# Patient Record
Sex: Female | Born: 1995 | Race: White | Hispanic: No | Smoking: Current every day smoker
Health system: Southern US, Community
[De-identification: ages and names within clinical notes are randomized; demographics above are authoritative.]

## PROBLEM LIST (undated history)

## (undated) DIAGNOSIS — E282 Polycystic ovarian syndrome: Secondary | ICD-10-CM

## (undated) HISTORY — PX: TONSILLECTOMY: SUR1361

---

## 2020-08-05 ENCOUNTER — Emergency Department (HOSPITAL_COMMUNITY)
Admission: EM | Admit: 2020-08-05 | Discharge: 2020-08-05 | Disposition: A | Discharge status: Home / Self Care | Payer: Self-pay | Attending: Emergency Medicine | Admitting: Emergency Medicine

## 2020-08-05 ENCOUNTER — Encounter (HOSPITAL_COMMUNITY): Payer: Self-pay

## 2020-08-05 ENCOUNTER — Emergency Department (HOSPITAL_COMMUNITY): Payer: Self-pay

## 2020-08-05 ENCOUNTER — Other Ambulatory Visit: Payer: Self-pay

## 2020-08-05 DIAGNOSIS — N760 Acute vaginitis: Secondary | ICD-10-CM | POA: Insufficient documentation

## 2020-08-05 DIAGNOSIS — R197 Diarrhea, unspecified: Secondary | ICD-10-CM | POA: Insufficient documentation

## 2020-08-05 DIAGNOSIS — F17210 Nicotine dependence, cigarettes, uncomplicated: Secondary | ICD-10-CM | POA: Insufficient documentation

## 2020-08-05 DIAGNOSIS — R102 Pelvic and perineal pain: Secondary | ICD-10-CM

## 2020-08-05 DIAGNOSIS — R110 Nausea: Secondary | ICD-10-CM | POA: Insufficient documentation

## 2020-08-05 DIAGNOSIS — R1084 Generalized abdominal pain: Secondary | ICD-10-CM | POA: Insufficient documentation

## 2020-08-05 DIAGNOSIS — B9689 Other specified bacterial agents as the cause of diseases classified elsewhere: Secondary | ICD-10-CM | POA: Insufficient documentation

## 2020-08-05 DIAGNOSIS — R531 Weakness: Secondary | ICD-10-CM | POA: Insufficient documentation

## 2020-08-05 DIAGNOSIS — Z20822 Contact with and (suspected) exposure to covid-19: Secondary | ICD-10-CM | POA: Insufficient documentation

## 2020-08-05 HISTORY — DX: Polycystic ovarian syndrome: E28.2

## 2020-08-05 LAB — COMPREHENSIVE METABOLIC PANEL
ALT: 23 U/L (ref 0–44)
AST: 18 U/L (ref 15–41)
Albumin: 4.3 g/dL (ref 3.5–5.0)
Alkaline Phosphatase: 36 U/L — ABNORMAL LOW (ref 38–126)
Anion gap: 4 — ABNORMAL LOW (ref 5–15)
BUN: 18 mg/dL (ref 6–20)
CO2: 28 mmol/L (ref 22–32)
Calcium: 9.2 mg/dL (ref 8.9–10.3)
Chloride: 105 mmol/L (ref 98–111)
Creatinine, Ser: 0.72 mg/dL (ref 0.44–1.00)
GFR, Estimated: >60 mL/min (ref >60)
Glucose, Bld: 93 mg/dL (ref 70–99)
Potassium: 3.9 mmol/L (ref 3.5–5.1)
Sodium: 137 mmol/L (ref 135–145)
Total Bilirubin: 0.8 mg/dL (ref 0.3–1.2)
Total Protein: 7.4 g/dL (ref 6.5–8.1)

## 2020-08-05 LAB — CBC
HCT: 42.5 % (ref 36.0–46.0)
Hemoglobin: 14 g/dL (ref 12.0–15.0)
MCH: 28.6 pg (ref 26.0–34.0)
MCHC: 32.9 g/dL (ref 30.0–36.0)
MCV: 86.9 fL (ref 80.0–100.0)
Platelets: 207 10*3/uL (ref 150–400)
RBC: 4.89 MIL/uL (ref 3.87–5.11)
RDW: 13.1 % (ref 11.5–15.5)
WBC: 5 10*3/uL (ref 4.0–10.5)
nRBC: 0 % (ref 0.0–0.2)

## 2020-08-05 LAB — URINALYSIS, ROUTINE W REFLEX MICROSCOPIC
Bilirubin Urine: NEGATIVE
Glucose, UA: NEGATIVE mg/dL
Hgb urine dipstick: NEGATIVE
Ketones, ur: NEGATIVE mg/dL
Leukocytes,Ua: NEGATIVE
Nitrite: NEGATIVE
Protein, ur: NEGATIVE mg/dL
Specific Gravity, Urine: 1.025 (ref 1.005–1.030)
pH: 6 (ref 5.0–8.0)

## 2020-08-05 LAB — RESP PANEL BY RT-PCR (FLU A&B, COVID) ARPGX2
Influenza A by PCR: NEGATIVE
Influenza B by PCR: NEGATIVE
SARS Coronavirus 2 by RT PCR: NEGATIVE

## 2020-08-05 LAB — WET PREP, GENITAL
Sperm: NONE SEEN
Trich, Wet Prep: NONE SEEN
Yeast Wet Prep HPF POC: NONE SEEN

## 2020-08-05 LAB — HCG, QUANTITATIVE, PREGNANCY: hCG, Beta Chain, Quant, S: <1 m[IU]/mL (ref <5)

## 2020-08-05 LAB — LIPASE, BLOOD: Lipase: 45 U/L (ref 11–51)

## 2020-08-05 LAB — PREGNANCY, URINE: Preg Test, Ur: NEGATIVE

## 2020-08-05 MED ORDER — METRONIDAZOLE 500 MG PO TABS: 500.0000 mg | ORAL_TABLET | Freq: Two times a day (BID) | ORAL | 0 refills | Status: AC

## 2020-08-05 MED ORDER — FENTANYL CITRATE (PF) 100 MCG/2ML IJ SOLN
50.0000 ug | Freq: Once | INTRAMUSCULAR | Status: AC
  Administered 2020-08-05: 50 ug via INTRAVENOUS
  Filled 2020-08-05: qty 2

## 2020-08-05 MED ORDER — KETOROLAC TROMETHAMINE 30 MG/ML IJ SOLN
30.0000 mg | Freq: Once | INTRAMUSCULAR | Status: AC
  Administered 2020-08-05: 30 mg via INTRAVENOUS
  Filled 2020-08-05: qty 1

## 2020-08-05 NOTE — ED Triage Notes (Signed)
Pt reports has had pain in both ovaries x 3 years.  Reports pain is radiating up into stomach.  Pt says she feels "electric shocks" and her heart starts to hurt.  Reports woke up with diarrhea, nausea, and cramping this morning.  Pt says she almost passed out.

## 2020-08-05 NOTE — ED Provider Notes (Signed)
[facility] EMERGENCY DEPARTMENT Provider Note   CSN: [id number] Arrival date & time: 08/05/20  0907     History Chief Complaint  Patient presents with   Abdominal Pain    Sarah Douglas is a 25 y.o. female.  HPI 25 year old female with PCOS presents to the ER with complaints of pelvic pain radiating into her abdomen, diarrhea, sharp stabbing pain which started this morning.  Patient reports an ongoing history of PCOS for the last 3 years.  States that she started to develop pain that would radiate into her abdomen occasionally.  This morning she woke up with similar symptoms, with also associated diarrhea, nausea, weakness.  She also endorses some scant vaginal discharge.  Denies any vaginal bleeding.  She is sexually active with her fianc.  She does not have an established OB/GYN here in the area as she states she just moved here from [place].  She does state that the pain she is currently experiencing does not feel like her typical PCOS pain.  She states she feels like something is going to "rupture".  She reports getting up this morning, having diarrhea, and almost syncopized and in the shower.  Denies any fevers or chills.  No dysuria or hematuria but states that she "needs to pee constantly".    Past Medical History:  Diagnosis Date   Polycystic disease, ovaries     There are no problems to display for this patient.   Past Surgical History:  Procedure Laterality Date   TONSILLECTOMY       OB History   No obstetric history on file.     No family history on file.  Social History   Tobacco Use   Smoking status: Every Day    Pack years: 0.00    Types: Cigarettes   Smokeless tobacco: Never  Vaping Use   Vaping Use: Never used  Substance Use Topics   Alcohol use: Never   Drug use: Never    Home Medications Prior to Admission medications   Medication Sig Start Date End Date Taking? Authorizing Provider  metroNIDAZOLE (FLAGYL) 500 MG tablet Take 1 tablet (500  mg total) by mouth 2 (two) times daily. 08/05/20  Yes Mare Ferrari, PA-C    Allergies    Penicillins and Pepto-bismol [bismuth subsalicylate]  Review of Systems   Review of Systems  Constitutional:  Negative for chills and fever.  HENT:  Negative for ear pain and sore throat.   Eyes:  Negative for pain and visual disturbance.  Respiratory:  Negative for cough and shortness of breath.   Cardiovascular:  Negative for chest pain and palpitations.  Gastrointestinal:  Positive for diarrhea and nausea. Negative for abdominal pain and vomiting.  Genitourinary:  Positive for pelvic pain and urgency. Negative for dysuria and hematuria.  Musculoskeletal:  Negative for arthralgias and back pain.  Skin:  Negative for color change and rash.  Neurological:  Negative for seizures and syncope.  All other systems reviewed and are negative.  Physical Exam Updated Vital Signs BP 117/80 (BP Location: Right Arm)   Pulse 62   Temp 97.9 F (36.6 C) (Oral)   Resp 18   Ht 5\' 7"  (1.702 m)   Wt 99.8 kg   LMP  (LMP Unknown) Comment: pt says has PCOS, lmp was approx 1 year ago.  SpO2 100%   BMI 34.46 kg/m   Physical Exam Vitals and nursing note reviewed.  Constitutional:      General: She is not in acute distress.  Appearance: She is well-developed.  HENT:     Head: Normocephalic and atraumatic.  Eyes:     Conjunctiva/sclera: Conjunctivae normal.  Cardiovascular:     Rate and Rhythm: Normal rate and regular rhythm.     Heart sounds: No murmur heard. Pulmonary:     Effort: Pulmonary effort is normal. No respiratory distress.     Breath sounds: Normal breath sounds.  Abdominal:     Palpations: Abdomen is soft.     Tenderness: There is generalized abdominal tenderness.  Genitourinary:    Cervix: No cervical motion tenderness.     Adnexa: Right adnexa normal and left adnexa normal.  Musculoskeletal:     Cervical back: Neck supple.  Skin:    General: Skin is warm and dry.  Neurological:      Mental Status: She is alert.    ED Results / Procedures / Treatments   Labs (all labs ordered are listed, but only abnormal results are displayed) Labs Reviewed  WET PREP, GENITAL - Abnormal; Notable for the following components:      Result Value   Clue Cells Wet Prep HPF POC PRESENT (*)    WBC, Wet Prep HPF POC RARE (*)    All other components within normal limits  URINALYSIS, ROUTINE W REFLEX MICROSCOPIC - Abnormal; Notable for the following components:   APPearance HAZY (*)    All other components within normal limits  COMPREHENSIVE METABOLIC PANEL - Abnormal; Notable for the following components:   Alkaline Phosphatase 36 (*)    Anion gap 4 (*)    All other components within normal limits  RESP PANEL BY RT-PCR (FLU A&B, COVID) ARPGX2  PREGNANCY, URINE  CBC  LIPASE, BLOOD  HCG, QUANTITATIVE, PREGNANCY  GC/CHLAMYDIA PROBE AMP ([place]) NOT AT [facility]    EKG EKG Interpretation  Date/Time:  Friday August 05 2020 09:36:21 EDT Ventricular Rate:  57 PR Interval:  124 QRS Duration: 82 QT Interval:  412 QTC Calculation: 401 R Axis:   30 Text Interpretation: Sinus bradycardia Otherwise normal ECG No old tracing to compare Confirmed by Susy Frizzle [phone]) on 08/05/2020 9:38:50 AM  Radiology No results found.  Procedures Procedures   Medications Ordered in ED Medications  fentaNYL (SUBLIMAZE) injection 50 mcg (50 mcg Intravenous Given 08/05/20 1147)  ketorolac (TORADOL) 30 MG/ML injection 30 mg (30 mg Intravenous Given 08/05/20 1352)    ED Course  I have reviewed the triage vital signs and the nursing notes.  Pertinent labs & imaging results that were available during my care of the patient were reviewed by me and considered in my medical decision making (see chart for details).    MDM Rules/Calculators/A&P                         25 year old female presents to the ER with pelvic pain radiating into the abdomen.  On arrival, she is anxious and uncomfortable  appearing, however nontoxic.  Vitals reassuring.  Physical exam with generalized pelvic and abdominal tenderness, patient is very hypersensitive to touch.  Pelvic exam with bilateral adnexal tenderness, patient very tearful throughout the entire pelvic exam.  No CVA tenderness.   DDx includes PCOS pain, ovarian torsion, TOA, ectopic pregnancy, viral gastroenteritis, less likely kidney stones, appendicitis, cholecystitis  CBC and CMP largely unremarkable.  Pregnancy negative.  Lipase negative.  COVID and flu are negative.  Wet prep with clue cells and rare WBCs.  Patient received small dose of fentanyl in the ER with  some improvement in her symptoms.  EKG without any ischemic changes, low suspicion for ACS.  Pelvic ultrasound unremarkable with no evidence of torsion, TOA.  Had a shared decision-making conversation with the patient, I suspect her pain is due to her PCOS, however given she also has abdominal complaints I did offer her a CT of the abdomen.  She refused this, stating she had one about 3 months ago.  Do think that her pain is likely due to PCOS.  Received an additional dose of Toradol, with further improvement of pain.  Will treat for BV, stressed OB/GYN follow-up.  We discussed return precautions.  She was understanding and is agreeable.  Stable for discharge.  Final Clinical Impression(s) / ED Diagnoses Final diagnoses:  Pelvic pain  Bacterial vaginosis    Rx / DC Orders ED Discharge Orders          Ordered    metroNIDAZOLE (FLAGYL) 500 MG tablet  2 times daily        08/05/20 1332             Leone Brand 08/10/20 1655    Pollyann Savoy, MD 08/11/20 [phone]

## 2020-08-05 NOTE — Discharge Instructions (Addendum)
You were evaluated in the Emergency Department and after careful evaluation, we did not find any emergent condition requiring admission or further testing in the hospital.  Please follow up with an OBGYN. I have provided their contact info in the discharge paperwork.  You may take Tylenol or ibuprofen for pain.  Please take the Flagyl as directed until finished.  Do not drink alcohol on this medication as it will make you nauseous and vomit.  Please return to the Emergency Department if you experience any worsening of your condition.    Thank you for allowing [place] to be a part of your care.

## 2020-08-08 LAB — GC/CHLAMYDIA PROBE AMP (~~LOC~~) NOT AT ARMC
Chlamydia: NEGATIVE
Comment: NEGATIVE
Comment: NORMAL
Neisseria Gonorrhea: NEGATIVE

## 2022-07-11 IMAGING — US US PELVIS COMPLETE WITH TRANSVAGINAL
2 series · 13 of 25 positions shown · non-contrast
Comparison: None

CLINICAL DATA: Lower abdominal pain for 3 years, worse today.
Reported history of polycystic ovarian syndrome.

EXAM:
TRANSABDOMINAL AND TRANSVAGINAL ULTRASOUND OF PELVIS
TECHNIQUE: Both transabdominal and transvaginal ultrasound examinations of the
pelvis were performed. Transabdominal technique was performed for
global imaging of the pelvis including uterus, ovaries, adnexal
regions, and pelvic cul-de-sac. It was necessary to proceed with
endovaginal exam following the transabdominal exam to visualize the
uterus, endometrium and ovaries to better advantage.

[Series 1: us pelvis complete with transvaginal · 0.24mm/px · 12 of 70 slices shown (1 of 2)]
[im 1/70]
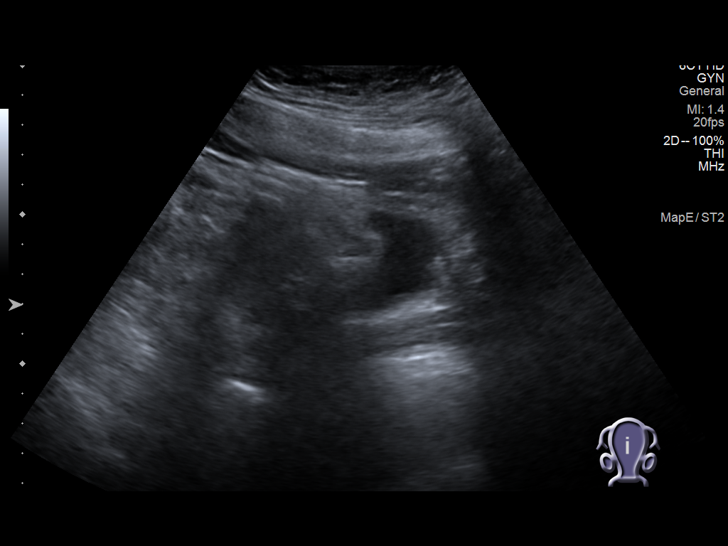
[im 7/70]
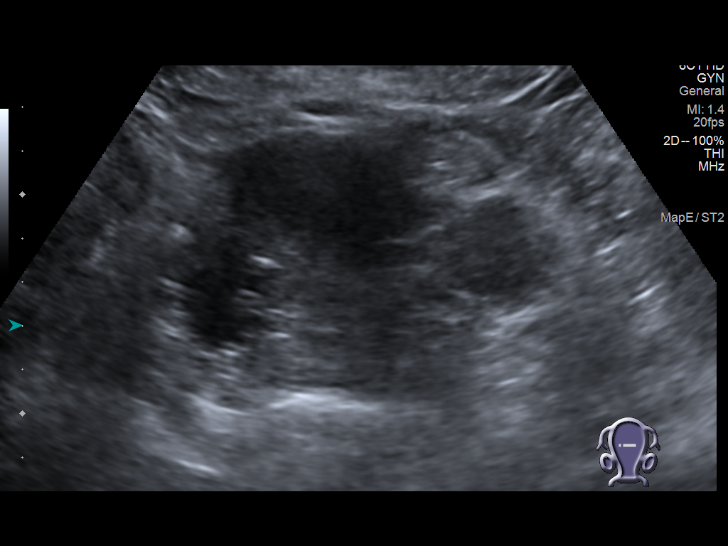
[im 13/70]
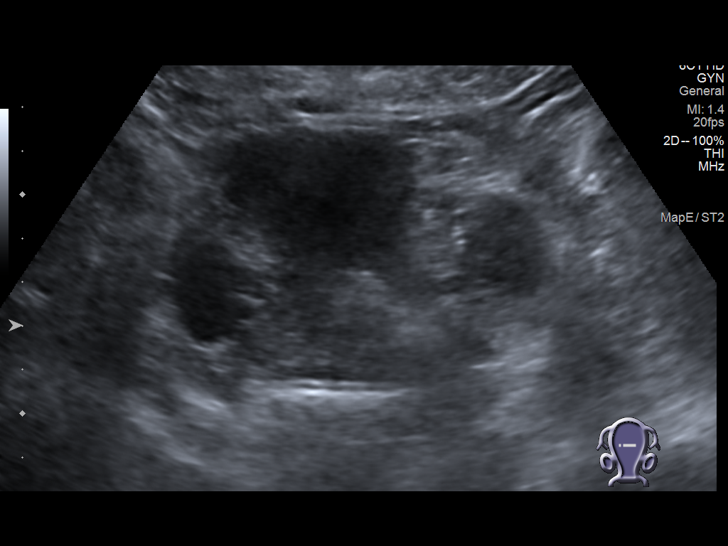
[im 19/70]
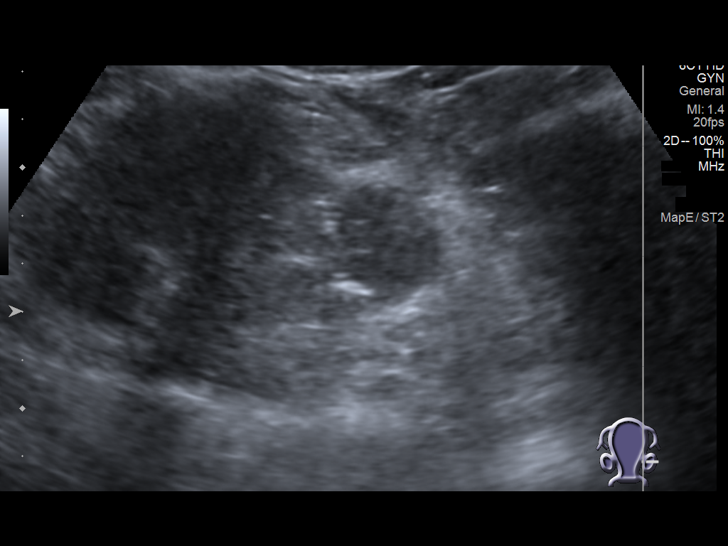
[im 26/70]
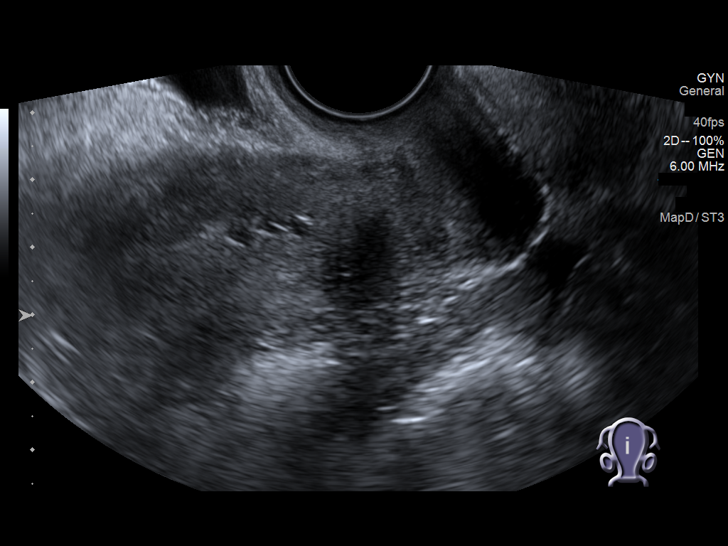
[im 32/70]
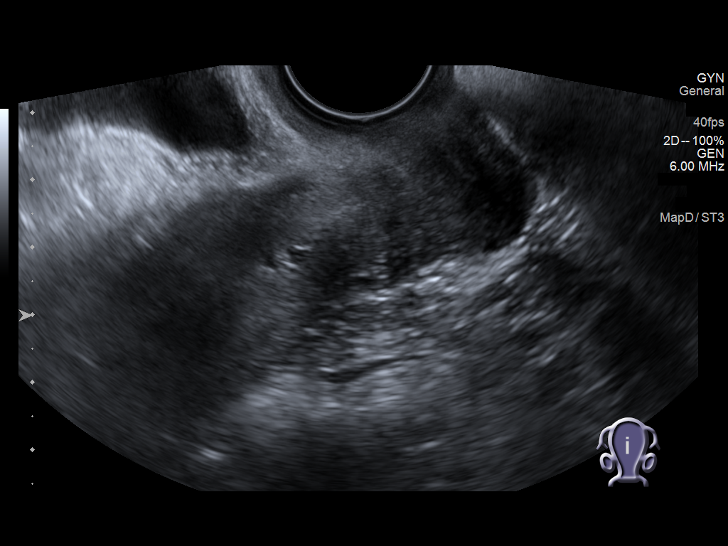
[im 38/70]
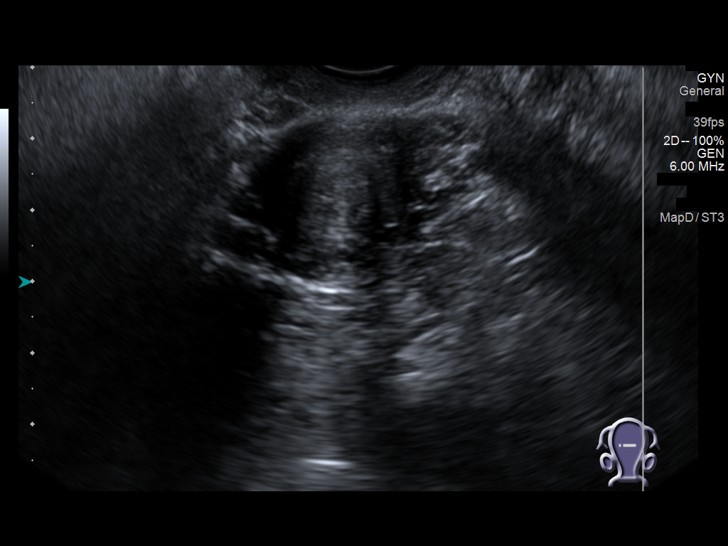
[im 44/70]
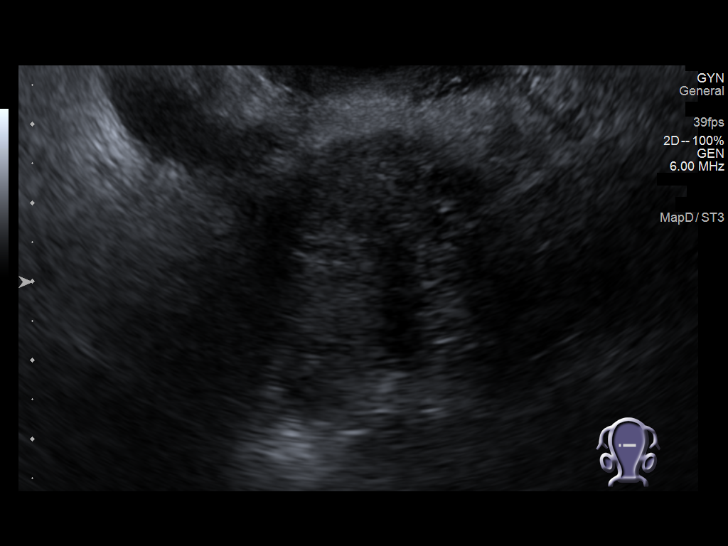
[im 51/70]
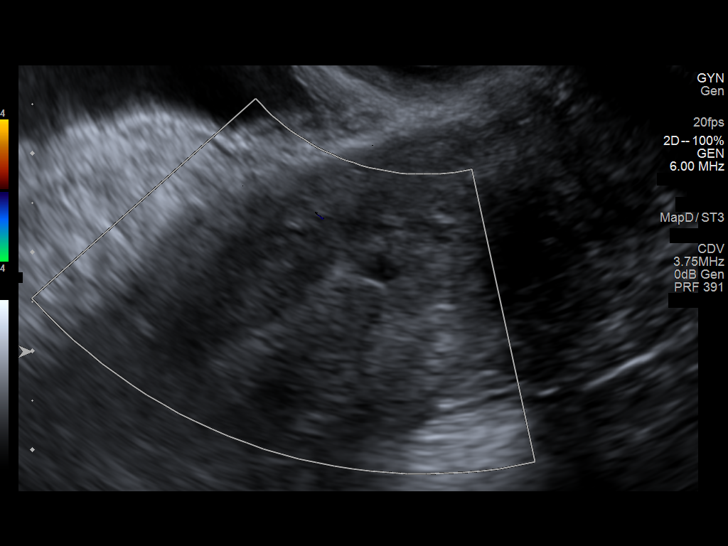
[im 57/70]
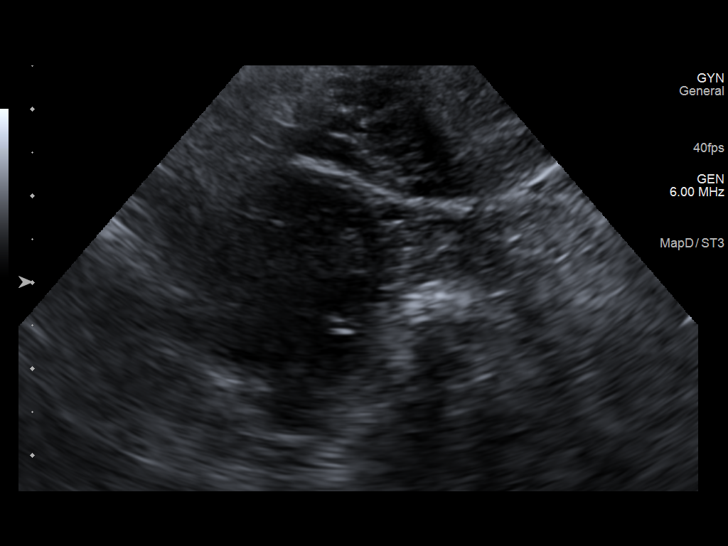
[im 63/70]
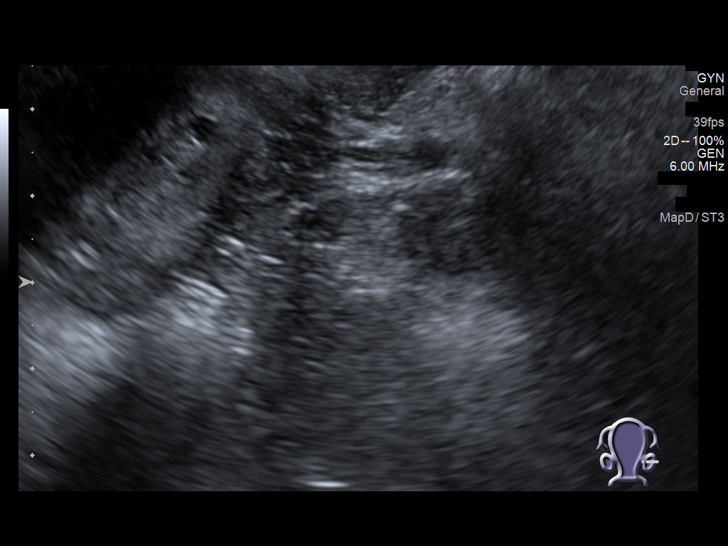
[im 70/70]
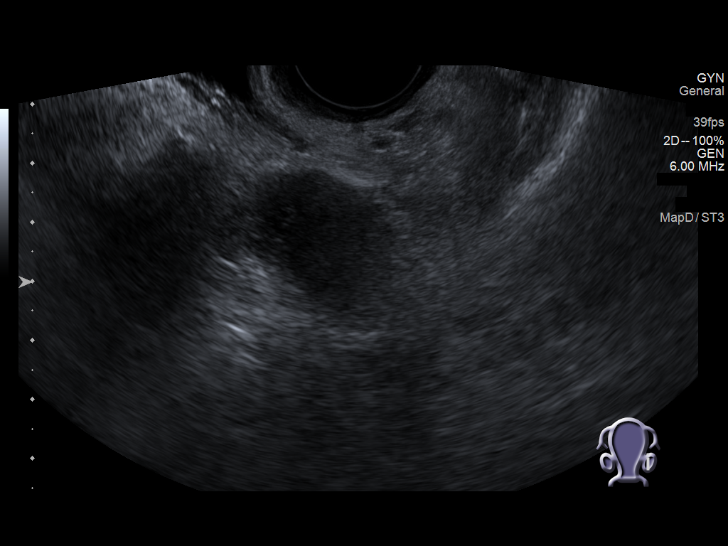

[Series 5: us pelvis complete with transvaginal · 0.08mm/px · 1 of 5 slices shown (2 of 2)]
[im 5/5]
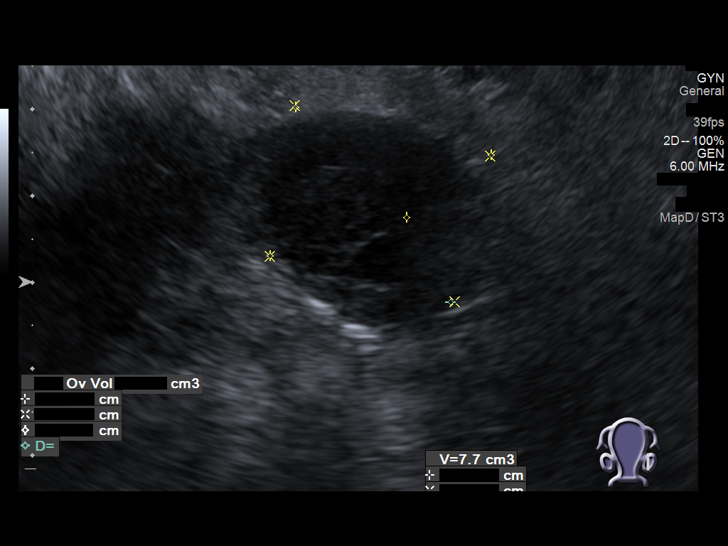

[13 of 25 positions shown; findings below may reference images not displayed]

FINDINGS: Uterus

Measurements: 6.9 x 3.6 x 3.6 cm = volume: 47 mL. No fibroids or
other mass visualized.

Endometrium

Thickness: 4 mm.  No focal abnormality visualized.

Right ovary

Measurements: 2.4 x 2.8 x 2.6 cm = volume: 9.3 mL. Normal
appearance/no adnexal mass.

Left ovary

Measurements: 2.9 x 2.8 x 1.7 cm = volume: 7.7 mL. Normal
appearance/no adnexal mass.

Other findings

No abnormal free fluid.
IMPRESSION: Normal transabdominal and endovaginal pelvic ultrasound.
